# Patient Record
Sex: Female | Born: 1995 | Race: White | Hispanic: No | Marital: Single | Smoking: Never smoker
Health system: Southern US, Community
[De-identification: ages and names within clinical notes are randomized; demographics above are authoritative.]

---

## 2020-05-01 ENCOUNTER — Emergency Department (INDEPENDENT_AMBULATORY_CARE_PROVIDER_SITE_OTHER): Payer: BC Managed Care – PPO

## 2020-05-01 ENCOUNTER — Other Ambulatory Visit: Payer: Self-pay

## 2020-05-01 ENCOUNTER — Emergency Department
Admission: EM | Admit: 2020-05-01 | Discharge: 2020-05-01 | Disposition: A | Discharge status: Home / Self Care | Payer: BC Managed Care – PPO | Source: Home / Self Care

## 2020-05-01 DIAGNOSIS — R2241 Localized swelling, mass and lump, right lower limb: Secondary | ICD-10-CM | POA: Diagnosis not present

## 2020-05-01 DIAGNOSIS — M25571 Pain in right ankle and joints of right foot: Secondary | ICD-10-CM

## 2020-05-01 DIAGNOSIS — S93401A Sprain of unspecified ligament of right ankle, initial encounter: Secondary | ICD-10-CM

## 2020-05-01 MED ORDER — DICLOFENAC SODIUM 50 MG PO TBEC: 50.0000 mg | DELAYED_RELEASE_TABLET | Freq: Two times a day (BID) | ORAL | 0 refills | Status: AC

## 2020-05-01 NOTE — ED Triage Notes (Signed)
Patient presents to Urgent Care with complaints of right ankle injury and pain since last night while rock climbing. Patient reports while she was climbing, her ankle wobbled aggressively to both sides, has been able to put some weight on it, but it is very painful. Applied ice and elevated last night, took tylenol this morning pta (1,000mg ).

## 2020-05-01 NOTE — ED Provider Notes (Signed)
[facility]    CSN: [id number] Arrival date & time: 05/01/20  1540      History   Chief Complaint Chief Complaint  Patient presents with  . Ankle Injury    Right    HPI Latoya Adkins is a 24 y.o. female.   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 She endorse pain of right ankle, swelling, and pain with weight bearing.  History reviewed. No pertinent past medical history.  There are no problems to display for this patient.   History reviewed. No pertinent surgical history.  OB History   No obstetric history on file.      Home Medications    Prior to Admission medications   Medication Sig Start Date End Date Taking? Authorizing Provider  ARIPiprazole (ABILIFY) 5 MG tablet Take 5 mg by mouth daily.   Yes [provider]  lamoTRIgine (LAMICTAL) 100 MG tablet Take 100 mg by mouth daily.   Yes [provider]    Family History Family History  Problem Relation Age of Onset  . Hyperlipidemia Mother   . Rheum arthritis Mother   . Hyperlipidemia Father     Social History Social History   Tobacco Use  . Smoking status: Never Smoker  . Smokeless tobacco: Never Used  Vaping Use  . Vaping Use: Never used  Substance Use Topics  . Alcohol use: Yes    Comment: rare  . Drug use: Not on file     Allergies   Patient has no known allergies.   Review of Systems Review of Systems Pertinent negatives listed in HPI  Physical Exam Triage Vital Signs ED Triage Vitals  Enc Vitals Group     BP 05/01/20 0846 106/70     Pulse Rate 05/01/20 0846 86     Resp 05/01/20 0846 15     Temp 05/01/20 0846 98.7 F (37.1 C)     Temp Source 05/01/20 0846 Oral     SpO2 05/01/20 0846 98 %     Weight --      Height 05/01/20 0844 5\' 4"  (1.626 m)     Head Circumference --      Peak Flow --      Pain Score 05/01/20 0844 5     Pain Loc --      Pain Edu? --       Excl. in GC? --    No data found.  Updated Vital Signs BP 106/70 (BP Location: Left Arm)   Pulse 86   Temp 98.7 F (37.1 C) (Oral)   Resp 15   Ht 5\' 4"  (1.626 m)   SpO2 98%   Visual Acuity Right Eye Distance:   Left Eye Distance:   Bilateral Distance:    Right Eye Near:   Left Eye Near:    Bilateral Near:     Physical Exam Constitutional:      Appearance: Normal appearance.  Cardiovascular:     Rate and Rhythm: Normal rate and regular rhythm.  Pulmonary:     Effort: Pulmonary effort is normal.     Breath sounds: Normal breath sounds.  Musculoskeletal:     Right ankle: Swelling present. Tenderness present over the lateral malleolus and medial malleolus. Decreased range of motion.     Right Achilles Tendon: Normal.  Neurological:     Mental Status: She is alert.    UC  Treatments / Results  Labs (all labs ordered are listed, but only abnormal results are displayed) Labs Reviewed - No data to display  EKG   Radiology DG Ankle Complete Right  Result Date: 05/01/2020 CLINICAL DATA:  Right ankle injury with pain and swelling, initial encounter EXAM: RIGHT ANKLE - COMPLETE 3+ VIEW COMPARISON:  None. FINDINGS: Lateral soft tissue swelling is noted. No acute fracture or dislocation is noted. IMPRESSION: Soft tissue swelling without acute bony abnormality. Electronically Signed   By: Alcide Clever M.D.   On: 05/01/2020 09:38    Procedures Procedures (including critical care time)  Medications Ordered in UC Medications - No data to display  Initial Impression / Assessment and Plan / UC Course  I have reviewed the triage vital signs and the nursing notes.  Pertinent labs & imaging results that were available during my care of the patient were reviewed by me and considered in my medical decision making (see chart for details).     Rightankle sprain injury.Placed in a cam walker with crutches remain nonweightbearing until follow-up with sports medicine.  Although  fracture involving the right ankle has been ruled out unable to rule out a ligament or tendon related injury.  Given the severe pain that you are experiencing weightbearing recommend follow-up with sports medicine.  Diclofenac as needed for pain.  When out of boot keep extremity elevated and apply ice to reduce swelling.  Contact the provider listed on your discharge paperwork on Monday to schedule next available appointment.  Final Clinical Impressions(s) / UC Diagnoses   Final diagnoses:  Sprain of right ankle, unspecified ligament, initial encounter   Discharge Instructions   None    ED Prescriptions    Medication Sig Dispense Auth. Provider   diclofenac (VOLTAREN) 50 MG EC tablet Take 1 tablet (50 mg total) by mouth 2 (two) times daily. 30 tablet Bing Neighbors, FNP     PDMP not reviewed this encounter.   Bing Neighbors, FNP 05/06/20 1335

## 2020-05-03 ENCOUNTER — Ambulatory Visit: Payer: BC Managed Care – PPO | Admitting: Family Medicine

## 2020-05-03 ENCOUNTER — Encounter: Payer: Self-pay | Admitting: Family Medicine

## 2020-05-03 ENCOUNTER — Other Ambulatory Visit: Payer: Self-pay

## 2020-05-03 VITALS — BP 110/70 | Ht 64.5 in | Wt 130.0 lb

## 2020-05-03 DIAGNOSIS — S93491A Sprain of other ligament of right ankle, initial encounter: Secondary | ICD-10-CM | POA: Diagnosis not present

## 2020-05-03 NOTE — Progress Notes (Signed)
    SUBJECTIVE:   CHIEF COMPLAINT / HPI:   Ankle sprain: Fell 3-4 feet while bouldering and landed awkwardly on her right ankle on dec 3rd.  She was able to bear weight after that but the next day it was too painful to put weight on it.   She went to the urgent care on the 4th.  She has not put weight on it since then but that is due to fear of injuring herself further.  She has been icing it 2-3 times a day.  She is taking diclofenac tabs BID. Pain "hasn't been that bad".   She is worried that putting weight on it or doing yoga will injure it further. No prior ankle injuries.  PERTINENT  PMH / PSH: none  OBJECTIVE:   BP 110/70   Ht 5' 4.5" (1.638 m)   Wt 130 lb (59 kg)   BMI 21.97 kg/m   Gen: alert, oriented.  No acute distress.  Appears stated age. Has hard boot and crutches with her.  MSK: mild swelling of the anterolateral right ankle. No bruising.  TTP on the anterolateral foot distal to the ankle joint.  Minimal TTP on the posterior medial and lateral malleolus. 2+ DP pulse.  Mild limitation ROM all directions. 2+ anterior drawer and 1+ talar tilt.   Limited MSK u/s right ankle:  Peroneal tendons intact.  No abnormalities of lateral malleolus.  Disruption of ATFL.  ASSESSMENT/PLAN:   Sprain of anterior talofibular ligament of right ankle Appears to have grade 2 sprain of the ATFL of the right ankle.  Imaging at urgent care negative for fracture.  - pt given home exercises - pt fitted for lace up ankle brace - advised to transition from boot to weight bearing with ankle brace as she tolerates.   - continue NSAIDs for pain as needed.  - ice and elevation of foot.  - return in 2 weeks.      Sandre Kitty, MD [facility]

## 2020-05-03 NOTE — Patient Instructions (Signed)
You have an ankle sprain. Ice the area for 15 minutes at a time, 3-4 times a day Aleve 2 tabs twice a day with food OR ibuprofen 3 tabs three times a day with food for pain and inflammation as needed. Elevate above the level of your heart when possible Crutches if needed to help with walking - transition out of these first when you feel comfortable Bear weight when tolerated Use boot when up and walking around to help with stability while you recover from this injury (transition to laceup brace when possible). Come out of the brace/boot twice a day to do Up/down and alphabet exercises 2-3 sets of each. Follow up in 2 weeks.

## 2020-05-03 NOTE — Assessment & Plan Note (Signed)
Appears to have grade 2 sprain of the ATFL of the right ankle.  Imaging at urgent care negative for fracture.  - pt given home exercises - pt fitted for lace up ankle brace - advised to transition from boot to weight bearing with ankle brace as she tolerates.   - continue NSAIDs for pain as needed.  - ice and elevation of foot.  - return in 2 weeks.

## 2020-05-06 NOTE — Progress Notes (Signed)
Pt called asking for a referral for PT for her right ankle. She lives in [place].  Referral form faxed to [facility] in [place]. They will contact pt to schedule appt.

## 2020-05-17 ENCOUNTER — Ambulatory Visit: Payer: BC Managed Care – PPO | Admitting: Family Medicine

## 2020-05-17 ENCOUNTER — Other Ambulatory Visit: Payer: Self-pay

## 2020-05-17 ENCOUNTER — Encounter: Payer: Self-pay | Admitting: Family Medicine

## 2020-05-17 DIAGNOSIS — S93491D Sprain of other ligament of right ankle, subsequent encounter: Secondary | ICD-10-CM

## 2020-05-17 NOTE — Assessment & Plan Note (Signed)
Improving with PT and home exercises.  Physical exam reassuring. -Continue PT and home exercises -Continue NSAIDs as needed for pain -Advised patient to stop wearing boot and use ankle brace as needed instead -Continue ice as needed for pain and swelling. -Advised patient to follow-up in 4 to 6 weeks if necessary.

## 2020-05-17 NOTE — Patient Instructions (Signed)
You have an ankle sprain. Ice the area for 15 minutes at a time, 3-4 times a day as needed Aleve or ibuprofen only if needed. Use brace when up and walking around for the next 2 weeks then only use it the following 2 weeks when doing a lot of walking or walking on irregular surfaces. Continue physical therapy and transition to home exercise program when you feel comfortable. Follow up in 4 weeks or as needed.

## 2020-05-17 NOTE — Progress Notes (Signed)
    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  Since her last visit she has been doing home exercises as well as in person PT.  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  She still wears the boot occasionally when she is on her feet a lot at work but otherwise he uses the lace up brace.  She denies any swelling, but does say she has some bruising after wearing the brace due to it being tight.  She still has some soreness in the ankle that is worse after using it.  She occasionally takes ibuprofen and will ice it daily at least once a day.  PERTINENT  PMH / PSH: L-spine  OBJECTIVE:   BP 108/72   Ht 5\' 4"  (1.626 m)   Wt 130 lb (59 kg)   BMI 22.31 kg/m   General: Alert, oriented.  No acute distress MSK: No swelling or bruising appreciated of the right ankle or foot.  Mildly decreased eversion ROM.  Pain with dorsiflexion and eversion over the anterolateral ankle.  Anterior drawer and talar tilt negative.  Mild tenderness palpation of the anterior aspect of the lateral malleolus.  Minimal tenderness over the posterior aspect of the medial malleolus.    ASSESSMENT/PLAN:   Sprain of anterior talofibular ligament of right ankle Improving with PT and home exercises.  Physical exam reassuring. -Continue PT and home exercises -Continue NSAIDs as needed for pain -Advised patient to stop wearing boot and use ankle brace as needed instead -Continue ice as needed for pain and swelling. -Advised patient to follow-up in 4 to 6 weeks if necessary.     , MD [facility]

## 2020-06-21 ENCOUNTER — Ambulatory Visit (INDEPENDENT_AMBULATORY_CARE_PROVIDER_SITE_OTHER): Payer: BC Managed Care – PPO | Admitting: Family Medicine

## 2020-06-21 ENCOUNTER — Encounter: Payer: Self-pay | Admitting: Family Medicine

## 2020-06-21 ENCOUNTER — Other Ambulatory Visit: Payer: Self-pay

## 2020-06-21 DIAGNOSIS — S93491D Sprain of other ligament of right ankle, subsequent encounter: Secondary | ICD-10-CM | POA: Diagnosis not present

## 2020-06-21 NOTE — Progress Notes (Signed)
PCP: Patient, No Pcp Per  Subjective:   HPI: Patient is a 25 y.o. female here for right ankle injury.  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  Since her last visit she has been doing home exercises as well as in person PT.  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  She still wears the boot occasionally when she is on her feet a lot at work but otherwise he uses the lace up brace.  She denies any swelling, but does say she has some bruising after wearing the brace due to it being tight.  She still has some soreness in the ankle that is worse after using it.  She occasionally takes ibuprofen and will ice it daily at least once a day.  06/21/20: Patient reports she continues to improve albeit slowly. Ankle still feels tight with some motion limitation especially with internal rotation and dorsiflexion. Doing well in physical therapy and with home exercises. Some yoga poses are uncomfortable. Has not returned to climbing.  History reviewed. No pertinent past medical history.  Current Outpatient Medications on File Prior to Visit  Medication Sig Dispense Refill  . ARIPiprazole (ABILIFY) 5 MG tablet Take 5 mg by mouth daily.    . diclofenac (VOLTAREN) 50 MG EC tablet Take 1 tablet (50 mg total) by mouth 2 (two) times daily. 30 tablet 0  . lamoTRIgine (LAMICTAL) 200 MG tablet Take 200 mg by mouth daily.    . norethindrone-ethinyl estradiol (LOESTRIN) 1-20 MG-MCG tablet Take 1 tablet by mouth daily.     No current facility-administered medications on file prior to visit.    History reviewed. No pertinent surgical history.  No Known Allergies  Social History   Socioeconomic History  . Marital status: Single    Spouse name: Not on file  . Number of children: Not on file  . Years of education: Not on file  . Highest education level: Not on file  Occupational History  . Not on file  Tobacco Use  .  Smoking status: Never Smoker  . Smokeless tobacco: Never Used  Vaping Use  . Vaping Use: Never used  Substance and Sexual Activity  . Alcohol use: Yes    Comment: rare  . Drug use: Not on file  . Sexual activity: Not on file  Other Topics Concern  . Not on file  Social History Narrative  . Not on file   Social Determinants of Health   Financial Resource Strain: Not on file  Food Insecurity: Not on file  Transportation Needs: Not on file  Physical Activity: Not on file  Stress: Not on file  Social Connections: Not on file  Intimate Partner Violence: Not on file    Family History  Problem Relation Age of Onset  . Hyperlipidemia Mother   . Rheum arthritis Mother   . Hyperlipidemia Father     BP 90/60   Ht 5\' 4"  (1.626 m)   Wt 130 lb (59 kg)   BMI 22.31 kg/m   [facility] 05/03/2020 05/17/2020  Frequency of aerobic exercise (# of days/week) 6 6  Average time in minutes 75 75  Frequency of strengthening activities (# of days/week) 1 1    No flowsheet data found.  Review of Systems: See HPI above.     Objective:  Physical Exam:  Gen: NAD, comfortable in exam room  Right ankle: No gross  deformity, swelling, ecchymoses FROM Mild TTP over ATFL, less over deltoid ligament.  No bony tenderness 1+ ant drawer and negative talar tilt.   Negative syndesmotic compression. Thompsons test negative. NV intact distally.    Assessment & Plan:  1. Right ankle injury - 2/2 lateral ankle sprain.  Improving.  Continue with physical therapy, home exercises.  Stopped using ASO.  Icing, aleve or ibuprofen only if needed.  F/u in 5-6 weeks.

## 2020-06-21 NOTE — Patient Instructions (Signed)
Icing, aleve or ibuprofen only if needed. Continue physical therapy and home exercises. Follow up with me in 5-6 weeks for reevaluation.

## 2020-08-02 ENCOUNTER — Ambulatory Visit: Payer: Self-pay | Admitting: Family Medicine

## 2020-08-04 ENCOUNTER — Encounter: Payer: Self-pay | Admitting: Family Medicine

## 2020-08-04 ENCOUNTER — Ambulatory Visit: Payer: BC Managed Care – PPO | Admitting: Family Medicine

## 2020-08-04 ENCOUNTER — Other Ambulatory Visit: Payer: Self-pay

## 2020-08-04 VITALS — BP 106/64 | Ht 64.0 in | Wt 130.0 lb

## 2020-08-04 DIAGNOSIS — S93491D Sprain of other ligament of right ankle, subsequent encounter: Secondary | ICD-10-CM

## 2020-08-04 NOTE — Progress Notes (Signed)
PCP: Patient, No Pcp Per  Subjective:   HPI: Patient is a 25 y.o. female here for right ankle injury.  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  Since her last visit she has been doing home exercises as well as in person PT.  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  She still wears the boot occasionally when she is on her feet a lot at work but otherwise he uses the lace up brace.  She denies any swelling, but does say she has some bruising after wearing the brace due to it being tight.  She still has some soreness in the ankle that is worse after using it.  She occasionally takes ibuprofen and will ice it daily at least once a day.  06/21/20: Patient reports she continues to improve albeit slowly. Ankle still feels tight with some motion limitation especially with internal rotation and dorsiflexion. Doing well in physical therapy and with home exercises. Some yoga poses are uncomfortable. Has not returned to climbing.  3/9: Patient reports she's doing much better. Finished with physical therapy and is doing home exercises. Has some very light pain when in full plantar and dorsiflexion.  History reviewed. No pertinent past medical history.  Current Outpatient Medications on File Prior to Visit  Medication Sig Dispense Refill  . ARIPiprazole (ABILIFY) 5 MG tablet Take 5 mg by mouth daily.    Marland Kitchen buPROPion (WELLBUTRIN XL) 150 MG 24 hr tablet Take 150 mg by mouth every morning.    . diclofenac (VOLTAREN) 50 MG EC tablet Take 1 tablet (50 mg total) by mouth 2 (two) times daily. 30 tablet 0  . lamoTRIgine (LAMICTAL) 200 MG tablet Take 200 mg by mouth daily.    . norethindrone-ethinyl estradiol (LOESTRIN) 1-20 MG-MCG tablet Take 1 tablet by mouth daily.     No current facility-administered medications on file prior to visit.    History reviewed. No pertinent surgical history.  No Known  Allergies  Social History   Socioeconomic History  . Marital status: Single    Spouse name: Not on file  . Number of children: Not on file  . Years of education: Not on file  . Highest education level: Not on file  Occupational History  . Not on file  Tobacco Use  . Smoking status: Never Smoker  . Smokeless tobacco: Never Used  Vaping Use  . Vaping Use: Never used  Substance and Sexual Activity  . Alcohol use: Yes    Comment: rare  . Drug use: Not on file  . Sexual activity: Not on file  Other Topics Concern  . Not on file  Social History Narrative  . Not on file   Social Determinants of Health   Financial Resource Strain: Not on file  Food Insecurity: Not on file  Transportation Needs: Not on file  Physical Activity: Not on file  Stress: Not on file  Social Connections: Not on file  Intimate Partner Violence: Not on file    Family History  Problem Relation Age of Onset  . Hyperlipidemia Mother   . Rheum arthritis Mother   . Hyperlipidemia Father     BP 106/64   Ht 5\' 4"  (1.626 m)   Wt 130 lb (59 kg)   BMI 22.31 kg/m   [facility] 05/03/2020 05/17/2020  Frequency of aerobic exercise (# of days/week) 6 6  Average time in  minutes 75 75  Frequency of strengthening activities (# of days/week) 1 1    No flowsheet data found.  Review of Systems: See HPI above.     Objective:  Physical Exam:  Gen: NAD, comfortable in exam room  Right ankle: No gross deformity, swelling, ecchymoses FROM No TTP. Negative ant drawer and talar tilt.   Negative syndesmotic compression. Thompsons test negative. NV intact distally.   Assessment & Plan:  1. Right ankle injury - 2/2 lateral ankle sprain, much improved.  Continue home exercises for another 4 weeks.  Icing, aleve or ibuprofen if needed.  F/u prn.

## 2022-06-12 IMAGING — DX DG ANKLE COMPLETE 3+V*R*
3 series · 3 of 3 positions shown · non-contrast
Comparison: None.

CLINICAL DATA: Right ankle injury with pain and swelling, initial
encounter

EXAM:
RIGHT ANKLE - COMPLETE 3+ VIEW

[ankle ap]
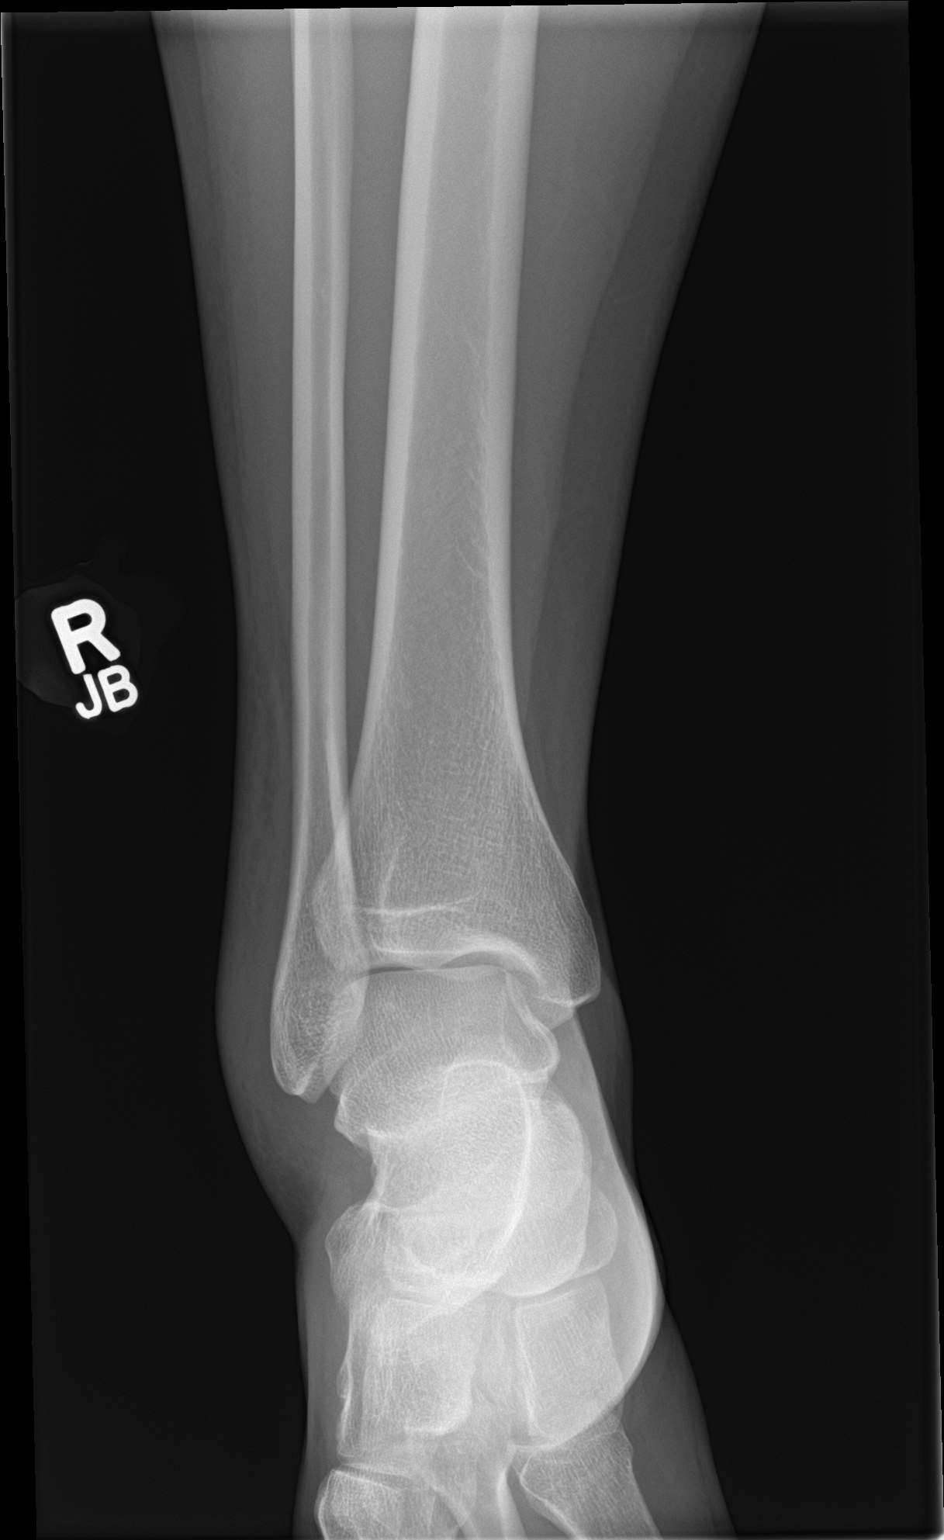

[ankle obl]
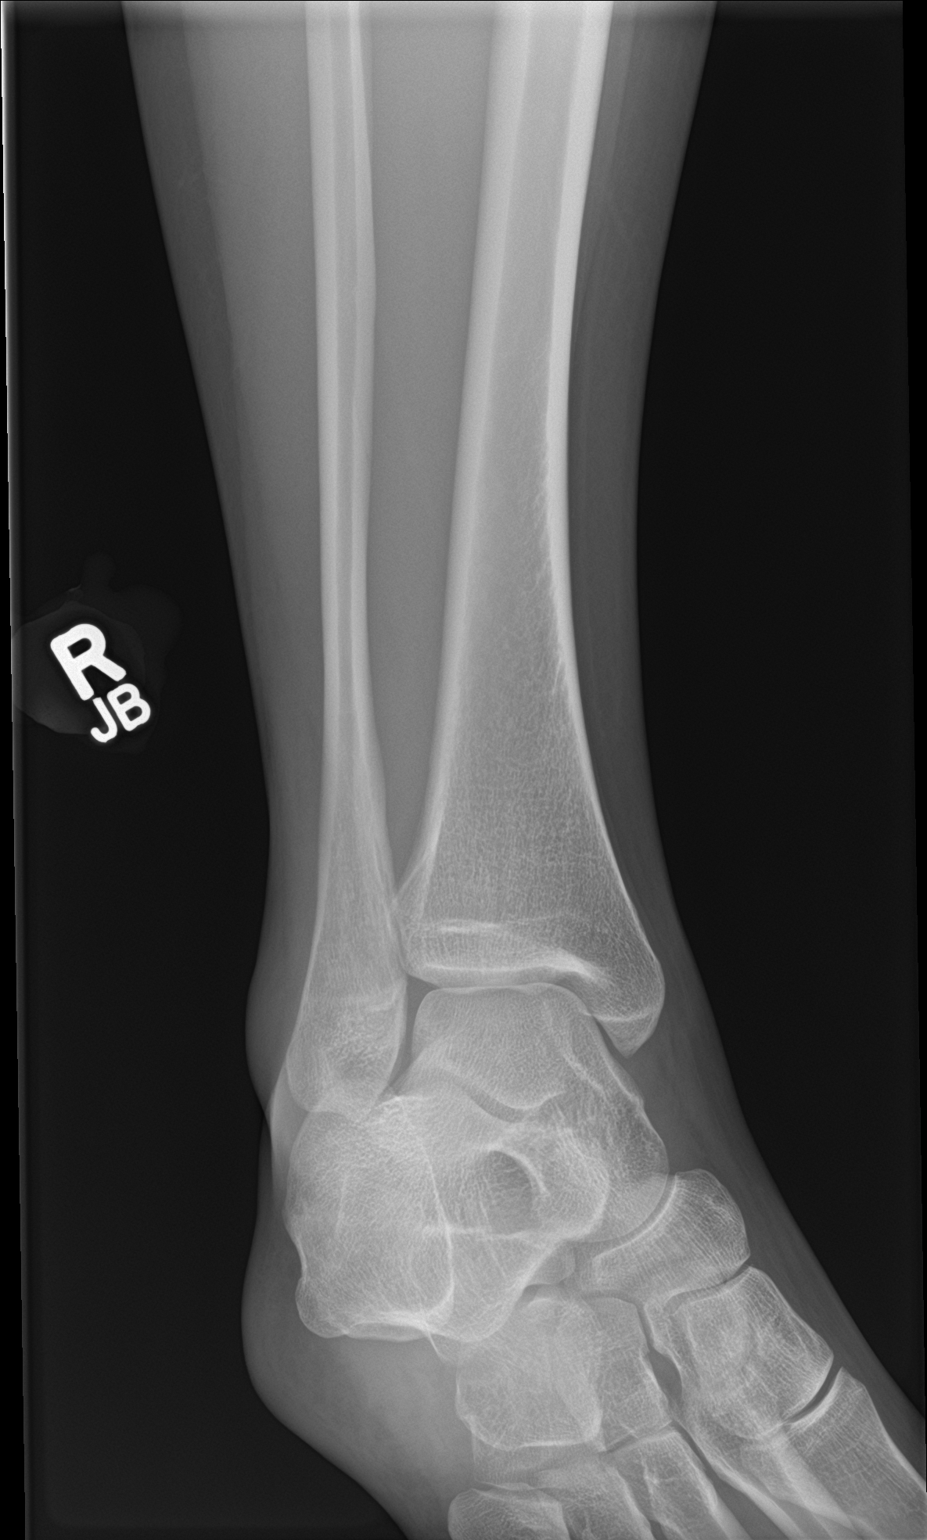

[ankle lat]
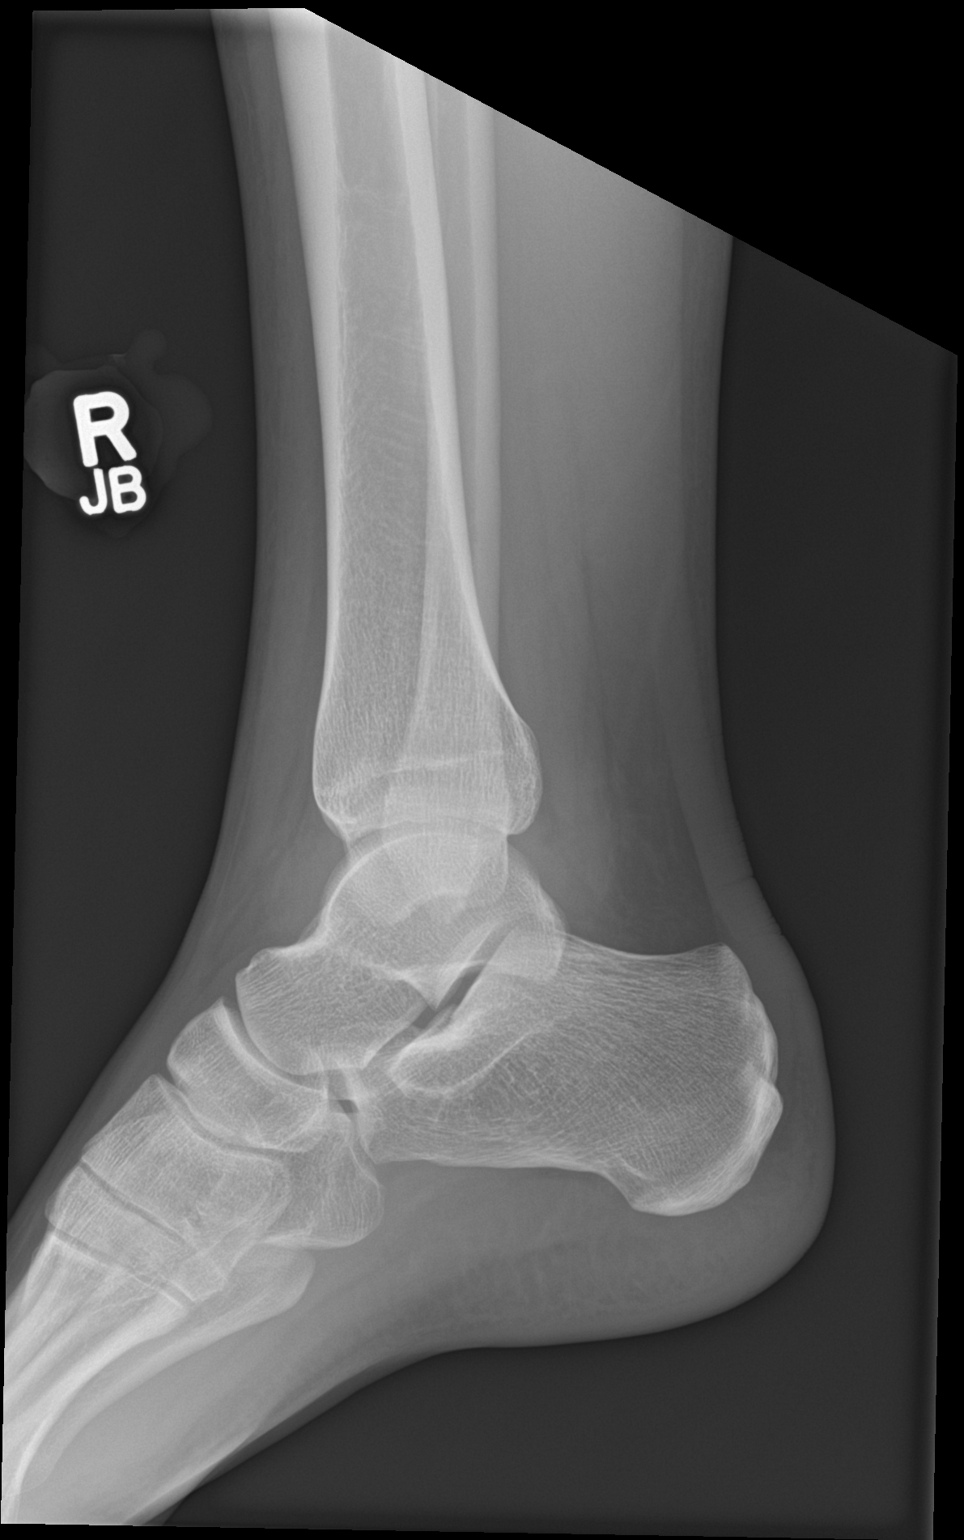

[3 of 3 positions shown; findings below may reference images not displayed]

FINDINGS: Lateral soft tissue swelling is noted. No acute fracture or
dislocation is noted.
IMPRESSION: Soft tissue swelling without acute bony abnormality.
